# Patient Record
Sex: Female | Born: 2005 | Race: White | Hispanic: No | Marital: Single | Smoking: Never smoker
Health system: Southern US, Community
[De-identification: ages and names within clinical notes are randomized; demographics above are authoritative.]

## PROBLEM LIST (undated history)

## (undated) DIAGNOSIS — R292 Abnormal reflex: Secondary | ICD-10-CM

## (undated) DIAGNOSIS — T7840XA Allergy, unspecified, initial encounter: Secondary | ICD-10-CM

## (undated) DIAGNOSIS — J45909 Unspecified asthma, uncomplicated: Secondary | ICD-10-CM

## (undated) DIAGNOSIS — F419 Anxiety disorder, unspecified: Secondary | ICD-10-CM

## (undated) HISTORY — DX: Abnormal reflex: R29.2

## (undated) HISTORY — DX: Anxiety disorder, unspecified: F41.9

## (undated) HISTORY — PX: OTHER SURGICAL HISTORY: SHX169

## (undated) HISTORY — DX: Unspecified asthma, uncomplicated: J45.909

## (undated) HISTORY — DX: Allergy, unspecified, initial encounter: T78.40XA

---
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
## 2023-01-23 ENCOUNTER — Other Ambulatory Visit: Payer: Self-pay | Admitting: Family Medicine

## 2023-01-23 DIAGNOSIS — N920 Excessive and frequent menstruation with regular cycle: Secondary | ICD-10-CM

## 2023-01-23 MED ORDER — NORELGESTROMIN-ETH ESTRADIOL 150-35 MCG/24HR TD PTWK: 1.0000 | MEDICATED_PATCH | TRANSDERMAL | 3 refills | Status: DC

## 2023-01-23 NOTE — Telephone Encounter (Signed)
Copied from CRM [id number]. Topic: Clinical - Medication Refill >> Jan 23, 2023 12:38 PM Thomes Dinning wrote: Most Recent Primary Care Visit:  Provider: [facility]  Department: [facility]  Visit Type: LAB  Date: 07/10/2022  Medication: norelgestromin-ethinyl estradiol [facility]) 150-35 MCG/24HR transdermal patch  Has the patient contacted their pharmacy? Yes (Agent: If no, request that the patient contact the pharmacy for the refill. If patient does not wish to contact the pharmacy document the reason why and proceed with request.) (Agent: If yes, when and what did the pharmacy advise?)  Is this the correct pharmacy for this prescription? Yes If no, delete pharmacy and type the correct one.  This is the patient's preferred pharmacy:  [id number] - [place], [place] - [address] AT [facility] [address] [place] [address] Phone: [phone] Fax: [phone]   Has the prescription been filled recently? Yes  Is the patient out of the medication? Yes  Has the patient been seen for an appointment in the last year OR does the patient have an upcoming appointment? Yes  Can we respond through MyChart? Yes  Agent: Please be advised that Rx refills may take up to 3 business days. We ask that you follow-up with your pharmacy.

## 2023-02-03 ENCOUNTER — Other Ambulatory Visit: Payer: Self-pay | Admitting: Nurse Practitioner

## 2023-02-03 DIAGNOSIS — N920 Excessive and frequent menstruation with regular cycle: Secondary | ICD-10-CM

## 2023-02-07 ENCOUNTER — Encounter: Payer: Self-pay | Admitting: Family Medicine

## 2023-04-16 ENCOUNTER — Encounter: Payer: 59 | Admitting: Obstetrics and Gynecology

## 2023-04-23 ENCOUNTER — Ambulatory Visit: Payer: 59

## 2023-04-23 VITALS — BP 141/70 | HR 82 | Ht 61.0 in | Wt 99.0 lb

## 2023-04-23 DIAGNOSIS — Z3045 Encounter for surveillance of transdermal patch hormonal contraceptive device: Secondary | ICD-10-CM

## 2023-04-23 DIAGNOSIS — Z7689 Persons encountering health services in other specified circumstances: Secondary | ICD-10-CM

## 2023-04-23 DIAGNOSIS — R030 Elevated blood-pressure reading, without diagnosis of hypertension: Secondary | ICD-10-CM | POA: Diagnosis not present

## 2023-04-23 MED ORDER — NORELGESTROMIN-ETH ESTRADIOL 150-35 MCG/24HR TD PTWK: 1.0000 | MEDICATED_PATCH | TRANSDERMAL | 3 refills | Status: DC

## 2023-04-23 NOTE — Progress Notes (Signed)
 GYNECOLOGY OFFICE VISIT NOTE  History:   Yvonne Washington is a 18 year old here today to establish care.   Birth Control:  Patch; Satisfied  Yvonne Washington reports taking the pill and depo in the past.  Yvonne Washington states Yvonne Washington did not do well with taking pill and depo caused irregular bleeding.    Reproductive Concerns Sexually Active: Yes Partners Type: Female Number of partners in last year: One STD Testing: Declines  50% Condoms  Usage  Obstetrical History: G0P0000   Gynecological History: Yvonne Washington reports monthly cycle that is 7 days.  Yvonne Washington reports flow that is "really heavy."  Yvonne Washington reports passing of clots that can be as big as walnuts.  Yvonne Washington reports cramping that is not relieved with ibuprofen . Yvonne Washington reports frequent headaches daily.    Vaginal/GU Concerns: Yvonne Washington denies vaginal concerns. No issues with urination, constipation, or diarrhea. Yvonne Washington does report history of UTIs.   Breast Concerns/Exams: Yvonne Washington denies breast issues. Yvonne Washington endorses breast awareness.  Yvonne Washington denies family history of breast, uterine, cervical, or ovarian cancer.  Medical and Nutrition PCP: [facility]; Last Appt this school year Significant PMx: Asthma-Activity Induced; inhaler prn. Seasonal Allergy-OTC meds if needed.  Exercise: 3x/week for 1 hour per session.  Strength and Cardio Tobacco/Drugs/Alcohol/Vaping: Vaping once weekly with friends Nutrition: Endorses balanced intake.   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 Lives with mom and brother when not in school.  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   Hyper reflexia     Past Surgical History:  Procedure Laterality Date   PICU admission     [facility]. Age 18. Asthma exacerbation.  No intubation.    The following portions of the patient's history were reviewed and updated as appropriate: allergies, current  medications, past family history, past medical history, past social history, past surgical history and problem list.   Health Maintenance: Pap None   Mammogram: None.  Colonoscopy: None. Review of Systems:  Pertinent items noted in HPI and remainder of comprehensive ROS otherwise negative.    Objective:    Physical Exam BP (!) 141/70 (BP Location: Right Arm, Patient Position: Sitting, Cuff Size: Normal)   Pulse 82   Ht 5\' 1"  (1.549 m)   Wt 99 lb (44.9 kg)   LMP 04/15/2023 (Exact Date)   BMI 18.71 kg/m  Physical Exam Vitals reviewed.  Constitutional:      General: Yvonne Washington is not in acute distress.    Appearance: Normal appearance. Yvonne Washington is not ill-appearing.  HENT:     Head: Normocephalic and atraumatic.  Eyes:     Conjunctiva/sclera: Conjunctivae normal.  Cardiovascular:     Rate and Rhythm: Normal rate and regular rhythm.  Pulmonary:     Effort: Pulmonary effort is normal. No respiratory distress.     Breath sounds: Normal breath sounds.  Abdominal:     Palpations: Abdomen is soft.     Tenderness: There is no abdominal tenderness.  Musculoskeletal:        General: Normal range of motion.     Cervical back: Normal range of motion.  Skin:    General: Skin is warm and dry.  Neurological:     Mental Status: Yvonne Washington is alert and oriented to person, place, and time.  Psychiatric:        Mood and  Affect: Mood normal.        Behavior: Behavior normal.      Labs and Imaging No results found for this or any previous visit (from the past week). No results found.   Assessment & Plan:  18 year old female Establishment of Care Xulane Patches Sexually Active Elevated BP   Brief review of well woman exam and what to expect: *Informed that formal speculum exam with pap smear to begin at 18 years old based on ASCCP guidelines. *Informed that CBE will start at age 12 based on ACOG guidelines unless other factors arise prior to that. -Encouraged to activate and utilize Mychart for  reviewing of chart, labs, and communication with office. -Encouraged to continue moderate to vigorous exercise activity. -Encouraged to continue balanced nutritional intake. -Extensive discussion regarding blood pressure and usage of estrogen patch.  -Further reviewed potential discontinuation and initiation of other method. -Patient reports satisfaction with method. -Discussed potentially tricycling patches to reduce dysmenorrhea symptoms. However informed that other methods may manage symptoms better and reviewed potential increase in side effects or BTB.  -Discussed continued monitoring of symptoms and discontinuation if needed. -Information for other contraception methods placed in chart. -Plan to repeat blood pressure prior to leaving today. -Will send in script for Xulane patch per patient request. -Patient offered and declines STD testing today.  -Will follow up in 3-6 months to assess symptoms.      Routine preventative health maintenance measures emphasized. Please refer to After Visit Summary for other counseling recommendations.   No follow-ups on file.      Kraig Peru, CNM 04/23/2023

## 2023-06-24 ENCOUNTER — Encounter: Payer: Self-pay | Admitting: Obstetrics and Gynecology

## 2023-06-24 ENCOUNTER — Ambulatory Visit: Admitting: Obstetrics and Gynecology

## 2023-06-24 ENCOUNTER — Other Ambulatory Visit (HOSPITAL_COMMUNITY)
Admission: RE | Admit: 2023-06-24 | Discharge: 2023-06-24 | Disposition: A | Discharge status: Home / Self Care | Source: Ambulatory Visit | Attending: Obstetrics and Gynecology | Admitting: Obstetrics and Gynecology

## 2023-06-24 VITALS — BP 105/68 | HR 70 | Wt 100.0 lb

## 2023-06-24 DIAGNOSIS — Z113 Encounter for screening for infections with a predominantly sexual mode of transmission: Secondary | ICD-10-CM | POA: Insufficient documentation

## 2023-06-24 DIAGNOSIS — Z3202 Encounter for pregnancy test, result negative: Secondary | ICD-10-CM | POA: Diagnosis not present

## 2023-06-24 DIAGNOSIS — Z30430 Encounter for insertion of intrauterine contraceptive device: Secondary | ICD-10-CM | POA: Diagnosis not present

## 2023-06-24 MED ORDER — LEVONORGESTREL 19.5 MG IU IUD: INTRAUTERINE_SYSTEM | Freq: Once | INTRAUTERINE | Status: AC

## 2023-06-24 NOTE — Progress Notes (Unsigned)
    IUD INSERTION PROCEDURE NOTE  Yvonne Washington is a 18 y.o. G0P0000 here for [facility] IUD insertion. No GYN concerns.   She was counseled regarding the risks/benefits of IUD including insertion risk of infection, hemorrhage, damage to surrounding tissue and organs, uterine perforation. She was counseled regarding risks of IUD including implantation into uterine wall, malpositioning, misplacement out of the uterus, migration outside of uterus, possible need for hysteroscopic or laparoscopic removal, ovarian cysts, expulsion. She was advised that risk of pregnancy is low with negative UPT but is not zero and IUD insertion may cause miscarriage. Reviewed that she is also at slightly higher risk for ectopic pregnancy and she should take a pregnancy test if she believes she may be pregnant. She was advised to use backup method of protection for one week. She verbalized understanding of all of the above and consent signed.   No unprotected intercourse in last two weeks Last pap smear was n/a UPT today: negative  IUD Insertion  Patient identified and an adequate time out was performed. Speculum placed in the vagina. The cervix was cleaned with Betadine x 2 and grasped anteriorly with a single tooth tenaculum.  A uterine sound was used to sound the uterus to 7 cm;  the IUD was then placed per manufacturer's recommendations. Strings trimmed to 3 cm. Tenaculum was removed, good hemostasis noted with pressure and application of silver nitrate. Patient tolerated procedure well.   Patient was given post-procedure instructions.  She was reminded to have backup contraception for one week during this transition period between IUDs.  Patient was also asked to check IUD strings periodically and follow up in 4 weeks for IUD check.  Josette IUD placed   K. Yolanda Moats, MD, [facility] Attending Center for [facility])

## 2023-06-25 LAB — CERVICOVAGINAL ANCILLARY ONLY
Chlamydia: NEGATIVE
Comment: NEGATIVE
Comment: NEGATIVE
Comment: NORMAL
Neisseria Gonorrhea: NEGATIVE
Trichomonas: NEGATIVE

## 2023-07-02 LAB — POCT URINE PREGNANCY: Preg Test, Ur: NEGATIVE

## 2023-07-22 ENCOUNTER — Ambulatory Visit: Admitting: Obstetrics and Gynecology

## 2023-07-22 VITALS — BP 105/70 | HR 71 | Wt 107.0 lb

## 2023-07-22 DIAGNOSIS — Z30431 Encounter for routine checking of intrauterine contraceptive device: Secondary | ICD-10-CM

## 2023-07-22 NOTE — Progress Notes (Signed)
   ESTABLISHED GYNECOLOGY VISIT No chief complaint on file.   Subjective:  Yvonne Washington is a 18 y.o. G0P0000 presenting for IUD check.  Kyleena  IUD inserted 6/23.  Reports spotting that has improved Reports occasional cramping Denies pelvic pain or heavy bleeding Hasn't tried string checks yet    Review of Systems:   Pertinent items are noted in HPI  Pertinent History Reviewed:  Reviewed past medical,surgical, social and family history.  Reviewed problem list, medications and allergies.  Objective:  There were no vitals filed for this visit. Physical Examination:   General appearance - well appearing, and in no distress  Mental status - alert, oriented to person, place, and time  Psych:  normal mood and affect  Skin - warm and dry, normal color, no suspicious lesions noted  Pelvic -  VULVA: normal appearing vulva with no masses, tenderness or lesions   VAGINA: normal appearing vagina with normal color and discharge, no lesions   CERVIX: normal appearing cervix without discharge or lesions, IUD strings seen, normal length  Extremities:  No swelling or varicosities noted  Chaperone present for exam  Assessment and Plan:  1. IUD check up (Primary) Doing well, anticipatory guidance Reviewed strings checks F/u annually or prn    Rollo ONEIDA Bring, MD, FACOG Obstetrician & Gynecologist, [facility] for [facility], [facility]

## 2023-07-23 ENCOUNTER — Telehealth: Payer: Self-pay | Admitting: *Deleted

## 2023-07-23 NOTE — Telephone Encounter (Signed)
 Copied from CRM #[id number]. Topic: Appointments - Transfer of Care >> Jul 23, 2023  1:54 PM Donna E wrote: Pt is requesting to transfer FROM: Joesph Sear PA Pt is requesting to transfer TO: Dr Chandra Reason for requested transfer: provider no longer with office It is the responsibility of the team the patient would like to transfer to (Dr. Chandra) to reach out to the patient if for any reason this transfer is not acceptable.   ----------------------------------------------------------------------- From previous Reason for Contact - Scheduling: Patient/patient representative is calling to schedule an appointment. Refer to attachments for appointment information.

## 2023-07-23 NOTE — Telephone Encounter (Signed)
 Tried to contact patient to clarify this, she is scheduled with Saddie so I have changed her PCP to her.

## 2023-09-03 ENCOUNTER — Other Ambulatory Visit: Payer: Self-pay | Admitting: Medical Genetics

## 2023-09-09 ENCOUNTER — Ambulatory Visit

## 2023-09-09 ENCOUNTER — Other Ambulatory Visit: Payer: Self-pay

## 2023-09-09 VITALS — BP 114/80 | HR 120 | Wt 102.0 lb

## 2023-09-09 DIAGNOSIS — R61 Generalized hyperhidrosis: Secondary | ICD-10-CM | POA: Diagnosis not present

## 2023-09-09 DIAGNOSIS — F419 Anxiety disorder, unspecified: Secondary | ICD-10-CM | POA: Diagnosis not present

## 2023-09-09 MED ORDER — DRYSOL 20 % EX SOLN: Freq: Every day | CUTANEOUS | 2 refills | Status: AC

## 2023-09-09 MED ORDER — PROPRANOLOL HCL 20 MG PO TABS: 20.0000 mg | ORAL_TABLET | ORAL | 2 refills | Status: DC | PRN

## 2023-09-09 NOTE — Progress Notes (Signed)
 Acute Office Visit  Subjective:     Patient ID: Yvonne Washington, female    DOB: 07/17/2005, 18 y.o.   MRN: [id number]  Chief Complaint  Patient presents with   Follow-up    Patient in room #4 with her mother. Patient states she needing an referral to see a dermatology excessive sweating hands and feet.    HPI  History of Present Illness   Yvonne Washington is a 18 year old who presents with hyperhidrosis of the hands and feet. She is accompanied by her mother.  Palmar and plantar hyperhidrosis - Excessive sweating of the hands and feet - Sweating significantly interferes with daily activities - Sweating is exacerbated by nervousness, especially in anticipation of interviews for internships - Severity of sweating is sufficient to wet paper when writing - Sweating causes feet to slip in shoes during social events - Over-the-counter roll-on products and creams provide only 5-10 minutes of relief and are otherwise ineffective  Tachycardia associated with anxiety - Experiences a fast heart rate, often noticed by others - Heart rate increases with nervousness, such as during doctor visits    - Denies chest pain or shortness of breath   ROS Per HPI     Objective:    BP 114/80 (BP Location: Right Arm, Patient Position: Sitting, Cuff Size: Small)   Pulse (!) 120   Wt 102 lb (46.3 kg)   SpO2 98%   BMI 19.27 kg/m    Physical Exam Constitutional:      General: She is not in acute distress.    Appearance: Normal appearance.  Cardiovascular:     Rate and Rhythm: Regular rhythm. Tachycardia present.     Heart sounds: Normal heart sounds. No murmur heard.    No friction rub. No gallop.  Pulmonary:     Effort: Pulmonary effort is normal. No respiratory distress.     Breath sounds: Normal breath sounds.  Musculoskeletal:        General: No swelling.  Skin:    General: Skin is warm and dry.     Comments: Hands were warm and clammy to the touch with out rash or affected  skin integrity  Neurological:     General: No focal deficit present.     Mental Status: She is alert.  Psychiatric:        Mood and Affect: Mood normal.        Behavior: Behavior normal.        Thought Content: Thought content normal.     No results found for any visits on 09/09/23.      Assessment & Plan:      Hyperhidrosis -     Ambulatory referral to Dermatology  Excessive sweating Assessment & Plan: Chronic hyperhidrosis affecting daily activities, unresponsive to OTC treatments, worsened by anxiety. - Refer to [facility] for evaluation and management. Consider alternative private practices if wait time is excessive. - Prescribe aluminum chloride 20% topical for hands and feet, apply nightly for two weeks, then use as needed.   Anxiety Assessment & Plan: Anxiety exacerbates hyperhidrosis, especially during stress-inducing events. Propranolol  considered for symptom management without sedation. - Prescribe propranolol  20 mg 30 minutes before anxiety-inducing situations.   Other orders -     Drysol; Apply topically at bedtime.  Dispense: 35 mL; Refill: 2 -     Propranolol  HCl; Take 1 tablet (20 mg total) by mouth as needed.  Dispense: 30 tablet; Refill: 2  Return in about 3 months (around 12/09/2023) for Physical.  Saddie JULIANNA Sacks, PA-C

## 2023-09-09 NOTE — Assessment & Plan Note (Signed)
 Anxiety exacerbates hyperhidrosis, especially during stress-inducing events. Propranolol  considered for symptom management without sedation. - Prescribe propranolol  20 mg 30 minutes before anxiety-inducing situations.

## 2023-09-09 NOTE — Assessment & Plan Note (Signed)
 Chronic hyperhidrosis affecting daily activities, unresponsive to OTC treatments, worsened by anxiety. - Refer to [facility] for evaluation and management. Consider alternative private practices if wait time is excessive. - Prescribe aluminum chloride 20% topical for hands and feet, apply nightly for two weeks, then use as needed.

## 2023-09-09 NOTE — Patient Instructions (Signed)
 VISIT SUMMARY: Today, you were seen for excessive sweating of your hands and feet, as well as a fast heart rate associated with anxiety. We discussed how these issues are affecting your daily life and potential treatments to help manage them.  YOUR PLAN: -PALMAR AND PLANTAR HYPERHIDROSIS: This condition involves excessive sweating of the hands and feet, which can interfere with daily activities. You will be referred to [facility] for further evaluation and management. In the meantime, you are prescribed aluminum chloride topical to apply to your hands and feet nightly for two weeks.  -SITUATIONAL ANXIETY: Situational anxiety is anxiety that occurs in specific situations, which in your case, worsens your hyperhidrosis. To help manage this, you are prescribed propranolol  to take 30 minutes before any anxiety-inducing situations. I have sent in the 20 mg dose, but this is a small dose and we can increase to 40 mg if needed.   -TACHYCARDIA, UNSPECIFIED: Tachycardia is a condition where your heart beats faster than normal, especially in stressful situations. The propranolol  prescribed for your anxiety may also help reduce your heart rate.  INSTRUCTIONS: Please follow up with [facility] for your hyperhidrosis evaluation. Use the aluminum chloride topical as directed and take propranolol  before anxiety-inducing situations. If you have any questions or concerns, please contact our office.  If you have any problems before your next visit feel free to message me via MyChart (minor issues or questions) or call the office, otherwise you may reach out to schedule an office visit.  Thank you! Saddie Sacks, PA-C

## 2023-09-23 ENCOUNTER — Encounter

## 2023-10-11 ENCOUNTER — Ambulatory Visit
Admission: RE | Admit: 2023-10-11 | Discharge: 2023-10-11 | Disposition: A | Discharge status: Home / Self Care | Source: Ambulatory Visit | Attending: Family Medicine | Admitting: Family Medicine

## 2023-10-11 VITALS — BP 114/83 | HR 98 | Temp 98.3°F | Resp 16 | Ht 61.0 in | Wt 100.0 lb

## 2023-10-11 DIAGNOSIS — J029 Acute pharyngitis, unspecified: Secondary | ICD-10-CM | POA: Diagnosis present

## 2023-10-11 LAB — POCT MONO SCREEN (KUC): Mono, POC: NEGATIVE

## 2023-10-11 LAB — POCT RAPID STREP A (OFFICE): Rapid Strep A Screen: NEGATIVE

## 2023-10-11 MED ORDER — IBUPROFEN 600 MG PO TABS: 600.0000 mg | ORAL_TABLET | Freq: Three times a day (TID) | ORAL | 0 refills | Status: AC | PRN

## 2023-10-11 NOTE — ED Triage Notes (Signed)
 Patient states that about four days ago, they began experiencing a sore throat, with swollen and red tonsils, and possible white spots on them. They also report having a headache that started two days ago and persists. Additionally, they experienced one episode of nausea, but no vomiting. The patient mentions a change in their sense of taste. Their primary care provider is [facility], and there is no rash present.

## 2023-10-11 NOTE — Discharge Instructions (Signed)
 The mono test was negative.  Your strep test is negative.  Culture of the throat will be sent, and staff will notify you if that is in turn positive.  Take ibuprofen  600 mg--1 tab every 8 hours as needed for pain.

## 2023-10-11 NOTE — ED Provider Notes (Signed)
 [facility]    CSN: [id number] Arrival date & time: 10/11/23  1652      History   Chief Complaint Chief Complaint  Patient presents with   Sore Throat    HPI Yvonne Washington is a 18 y.o. female.    Sore Throat  Here for sore throat that began bothering her sometime around October 6 or 7.  No fever or chills.  She has had some intermittent nausea but no vomiting or diarrhea.  No cough and no nasal congestion.  She does note a change in her sense of taste.  Uncertain exposures.  She is in college  NKDA  periods have been irregular as she has an IUD  Past Medical History:  Diagnosis Date   Allergy    Anxiety    Asthma    PICU admission [facility] age 28.   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 Age 39. Asthma exacerbation.  No intubation.    OB History     Gravida  0   Para  0   Term  0   Preterm  0   AB  0   Living  0      SAB  0   IAB  0   Ectopic  0   Multiple  0   Live Births  0            Home Medications    Prior to Admission medications   Medication Sig Start Date End Date Taking? Authorizing Provider  acetaminophen (TYLENOL) 500 MG tablet Take 1,000 mg by mouth every 6 (six) hours as needed.   Yes [provider]  ibuprofen  (ADVIL ) 600 MG tablet Take 1 tablet (600 mg total) by mouth every 8 (eight) hours as needed (pain). 10/11/23  Yes Vonna Sharlet POUR, MD  levonorgestrel  (KYLEENA ) 19.5 MG IUD 1 each by Intrauterine route once.   Yes [provider]  albuterol  (VENTOLIN  HFA) 108 (90 Base) MCG/ACT inhaler Inhale 2 puffs into the lungs every 6 (six) hours as needed for wheezing or shortness of breath. Take one inhaler to school. 02/21/22   Boscia, Heather E, NP  aluminum chloride (DRYSOL) 20 % external solution Apply topically at bedtime. 09/09/23   Gayle Saddie FALCON, PA-C  cetirizine (ZYRTEC) 5 MG tablet Take 5 mg by mouth daily.    [provider]  Multiple Vitamin (MULTIVITAMIN) tablet Take 1 tablet by mouth daily.    [provider]  propranolol  (INDERAL ) 20 MG tablet Take 1 tablet (20 mg total) by mouth as needed. 09/09/23   Gayle Saddie FALCON, PA-C    Family History Family History  Problem Relation Age of Onset   Asthma Mother  Diabetes Father    Alcohol abuse Father    Asthma Brother     Social History Social History   Tobacco Use   Smoking status: Never    Passive exposure: Past   Smokeless tobacco: Never   Tobacco comments:    Vape once a week  Vaping Use   Vaping status: Some Days   Substances: Nicotine, Flavoring  Substance Use Topics   Alcohol use: Yes    Comment: Occassionally.   Drug use: Not Currently     Allergies   Patient has no known allergies.   Review of Systems Review of Systems   Physical Exam Triage Vital Signs ED Triage Vitals  Encounter Vitals Group     BP 10/11/23 1708 114/83     Girls Systolic BP Percentile --      Girls Diastolic BP Percentile --      Boys Systolic BP Percentile --      Boys Diastolic BP Percentile --      Pulse Rate 10/11/23 1708 98     Resp 10/11/23 1708 16     Temp 10/11/23 1708 98.3 F (36.8 C)     Temp Source 10/11/23 1708 Oral     SpO2 10/11/23 1708 100 %     Weight 10/11/23 1704 100  lb (45.4 kg)     Height 10/11/23 1704 5' 1 (1.549 m)     Head Circumference --      Peak Flow --      Pain Score 10/11/23 1659 0     Pain Loc --      Pain Education --      Exclude from Growth Chart --    No data found.  Updated Vital Signs BP 114/83 (BP Location: Left Arm)   Pulse 98   Temp 98.3 F (36.8 C) (Oral)   Resp 16   Ht 5' 1 (1.549 m)   Wt 45.4 kg   LMP  (LMP Unknown)   SpO2 100%   BMI 18.89 kg/m   Visual Acuity Right Eye Distance:   Left Eye Distance:   Bilateral Distance:    Right Eye Near:   Left Eye Near:    Bilateral Near:     Physical Exam Vitals reviewed.  Constitutional:      General: She is not in acute distress.    Appearance: She is not ill-appearing, toxic-appearing or diaphoretic.  HENT:     Right Ear: Tympanic membrane and ear canal normal.     Left Ear: Tympanic membrane and ear canal normal.     Nose: Nose normal.     Mouth/Throat:     Mouth: Mucous membranes are moist.     Comments: Tonsils are 2+ hypertrophied with intense erythema and white spots on them.  Uvula is in the midline Eyes:     Extraocular Movements: Extraocular movements intact.     Conjunctiva/sclera: Conjunctivae normal.     Pupils: Pupils are equal, round, and reactive to light.  Cardiovascular:     Rate and Rhythm: Normal rate and regular rhythm.     Heart sounds: No murmur heard. Pulmonary:     Effort: Pulmonary effort is normal. No respiratory distress.     Breath sounds: No stridor. No wheezing, rhonchi or rales.  Musculoskeletal:     Cervical back: Neck supple.  Lymphadenopathy:     Cervical: No cervical adenopathy.  Skin:    Capillary Refill: Capillary refill takes less than 2 seconds.     Coloration: Skin  is not jaundiced or pale.  Neurological:     General: No focal deficit present.     Mental Status: She is alert and oriented to person, place, and time.  Psychiatric:        Behavior: Behavior normal.      UC Treatments / Results  Labs (all  labs ordered are listed, but only abnormal results are displayed) Labs Reviewed  POCT RAPID STREP A (OFFICE) - Normal  POCT MONO SCREEN ([facility]) - Normal  CULTURE, GROUP A STREP [facility])    EKG   Radiology No results found.  Procedures Procedures (including critical care time)  Medications Ordered in UC Medications - No data to display  Initial Impression / Assessment and Plan / UC Course  I have reviewed the triage vital signs and the nursing notes.  Pertinent labs & imaging results that were available during my care of the patient were reviewed by me and considered in my medical decision making (see chart for details).     mononucleosis test is negative.  Rapid strep is negative.  Throat culture is sent and we will notify and treat protocol if that is positive  Ibuprofen  600 mg sent in for the pain.   Final Clinical Impressions(s) / UC Diagnoses   Final diagnoses:  Acute pharyngitis, unspecified etiology     Discharge Instructions      The mono test was negative.  Your strep test is negative.  Culture of the throat will be sent, and staff will notify you if that is in turn positive.  Take ibuprofen  600 mg--1 tab every 8 hours as needed for pain.       ED Prescriptions     Medication Sig Dispense Auth. Provider   ibuprofen  (ADVIL ) 600 MG tablet Take 1 tablet (600 mg total) by mouth every 8 (eight) hours as needed (pain). 15 tablet Meital Riehl K, MD      PDMP not reviewed this encounter.   Vonna Sharlet POUR, MD 10/11/23 1800

## 2023-10-13 LAB — CULTURE, GROUP A STREP (THRC)

## 2023-10-14 ENCOUNTER — Ambulatory Visit (HOSPITAL_COMMUNITY): Payer: Self-pay | Admitting: Family Medicine

## 2023-10-15 MED ORDER — AMOXICILLIN 500 MG PO CAPS: 500.0000 mg | ORAL_CAPSULE | Freq: Two times a day (BID) | ORAL | 0 refills | Status: AC

## 2023-10-15 NOTE — Telephone Encounter (Signed)
 Pt's mom called in inquiring about results from last visit and aftercare instructions. Mom was advised that pt has been communicated with via MyChart with steps on aftercare. Mom was also advised to have pt call in to speak with clinical staff if she has further concerns or questions. Mom verbalized understanding.

## 2023-10-15 NOTE — Addendum Note (Signed)
 Addended by: ARNALDO ALFONSO RAMAN on: 10/15/2023 03:23 PM   Modules accepted: Orders

## 2023-10-15 NOTE — Telephone Encounter (Signed)
 Pt called stating she is still having symptoms. Amoxicillin  per protocol. Reviewed with patient, verified pharmacy, prescription sent

## 2023-12-04 ENCOUNTER — Other Ambulatory Visit: Payer: Self-pay

## 2023-12-10 ENCOUNTER — Encounter

## 2023-12-23 ENCOUNTER — Encounter

## 2024-01-01 ENCOUNTER — Encounter

## 2024-02-21 ENCOUNTER — Encounter
